# Patient Record
Sex: Female | Born: 1985
Health system: Southern US, Community
[De-identification: ages and names within clinical notes are randomized; demographics above are authoritative.]

## PROBLEM LIST (undated history)

## (undated) DIAGNOSIS — F329 Major depressive disorder, single episode, unspecified: Secondary | ICD-10-CM

## (undated) DIAGNOSIS — F32A Depression, unspecified: Secondary | ICD-10-CM

## (undated) DIAGNOSIS — J45909 Unspecified asthma, uncomplicated: Secondary | ICD-10-CM

## (undated) DIAGNOSIS — D649 Anemia, unspecified: Secondary | ICD-10-CM

## (undated) HISTORY — PX: INCISION AND DRAINAGE PERITONSILLAR ABSCESS: SUR670

## (undated) HISTORY — PX: INCISION AND DRAINAGE PERIRECTAL ABSCESS: SHX1804

---

## 2018-07-19 ENCOUNTER — Other Ambulatory Visit: Payer: Self-pay

## 2018-07-19 ENCOUNTER — Encounter (HOSPITAL_COMMUNITY): Payer: Self-pay | Admitting: Emergency Medicine

## 2018-07-19 ENCOUNTER — Observation Stay (HOSPITAL_COMMUNITY)
Admission: EM | Admit: 2018-07-19 | Discharge: 2018-07-21 | Disposition: A | Discharge status: Home / Self Care | Payer: Self-pay | Attending: Surgery | Admitting: Surgery

## 2018-07-19 ENCOUNTER — Emergency Department (HOSPITAL_COMMUNITY): Payer: Self-pay

## 2018-07-19 DIAGNOSIS — K611 Rectal abscess: Secondary | ICD-10-CM | POA: Diagnosis present

## 2018-07-19 DIAGNOSIS — Z882 Allergy status to sulfonamides status: Secondary | ICD-10-CM | POA: Insufficient documentation

## 2018-07-19 DIAGNOSIS — Z79899 Other long term (current) drug therapy: Secondary | ICD-10-CM | POA: Insufficient documentation

## 2018-07-19 DIAGNOSIS — F329 Major depressive disorder, single episode, unspecified: Secondary | ICD-10-CM | POA: Insufficient documentation

## 2018-07-19 DIAGNOSIS — K612 Anorectal abscess: Principal | ICD-10-CM | POA: Insufficient documentation

## 2018-07-19 DIAGNOSIS — J45909 Unspecified asthma, uncomplicated: Secondary | ICD-10-CM | POA: Insufficient documentation

## 2018-07-19 DIAGNOSIS — Z791 Long term (current) use of non-steroidal anti-inflammatories (NSAID): Secondary | ICD-10-CM | POA: Insufficient documentation

## 2018-07-19 DIAGNOSIS — K610 Anal abscess: Secondary | ICD-10-CM

## 2018-07-19 HISTORY — DX: Depression, unspecified: F32.A

## 2018-07-19 HISTORY — DX: Anemia, unspecified: D64.9

## 2018-07-19 HISTORY — DX: Major depressive disorder, single episode, unspecified: F32.9

## 2018-07-19 HISTORY — DX: Unspecified asthma, uncomplicated: J45.909

## 2018-07-19 LAB — BASIC METABOLIC PANEL
Anion gap: 9 (ref 5–15)
BUN: 7 mg/dL (ref 6–20)
CO2: 21 mmol/L — ABNORMAL LOW (ref 22–32)
Calcium: 8.7 mg/dL — ABNORMAL LOW (ref 8.9–10.3)
Chloride: 109 mmol/L (ref 98–111)
Creatinine, Ser: 0.86 mg/dL (ref 0.44–1.00)
GFR calc Af Amer: >60 mL/min (ref >60)
GFR calc non Af Amer: >60 mL/min (ref >60)
Glucose, Bld: 94 mg/dL (ref 70–99)
Potassium: 3.8 mmol/L (ref 3.5–5.1)
SODIUM: 139 mmol/L (ref 135–145)

## 2018-07-19 LAB — CBC WITH DIFFERENTIAL/PLATELET
Abs Immature Granulocytes: 0.03 10*3/uL (ref 0.00–0.07)
BASOS PCT: 0 %
Basophils Absolute: 0 10*3/uL (ref 0.0–0.1)
EOS ABS: 0 10*3/uL (ref 0.0–0.5)
Eosinophils Relative: 0 %
HCT: 30 % — ABNORMAL LOW (ref 36.0–46.0)
Hemoglobin: 8.7 g/dL — ABNORMAL LOW (ref 12.0–15.0)
Immature Granulocytes: 0 %
Lymphocytes Relative: 16 %
Lymphs Abs: 1.4 10*3/uL (ref 0.7–4.0)
MCH: 22.8 pg — ABNORMAL LOW (ref 26.0–34.0)
MCHC: 29 g/dL — ABNORMAL LOW (ref 30.0–36.0)
MCV: 78.7 fL — ABNORMAL LOW (ref 80.0–100.0)
Monocytes Absolute: 0.9 10*3/uL (ref 0.1–1.0)
Monocytes Relative: 10 %
Neutro Abs: 6.8 10*3/uL (ref 1.7–7.7)
Neutrophils Relative %: 74 %
PLATELETS: 434 10*3/uL — AB (ref 150–400)
RBC: 3.81 MIL/uL — AB (ref 3.87–5.11)
RDW: 17.3 % — ABNORMAL HIGH (ref 11.5–15.5)
WBC: 9.1 10*3/uL (ref 4.0–10.5)
nRBC: 0 % (ref 0.0–0.2)

## 2018-07-19 LAB — I-STAT BETA HCG BLOOD, ED (MC, WL, AP ONLY): I-stat hCG, quantitative: <5 m[IU]/mL (ref <5)

## 2018-07-19 MED ORDER — FENTANYL CITRATE (PF) 100 MCG/2ML IJ SOLN
25.0000 ug | Freq: Once | INTRAMUSCULAR | Status: AC
  Administered 2018-07-19: 25 ug via INTRAVENOUS
  Filled 2018-07-19: qty 2

## 2018-07-19 MED ORDER — MORPHINE SULFATE (PF) 2 MG/ML IV SOLN
2.0000 mg | INTRAVENOUS | Status: DC | PRN
  Administered 2018-07-19 – 2018-07-20 (×3): 2 mg via INTRAVENOUS
  Filled 2018-07-19 (×4): qty 1

## 2018-07-19 MED ORDER — ONDANSETRON HCL 4 MG/2ML IJ SOLN
4.0000 mg | Freq: Four times a day (QID) | INTRAMUSCULAR | Status: DC | PRN
  Administered 2018-07-19 – 2018-07-20 (×2): 4 mg via INTRAVENOUS
  Filled 2018-07-19: qty 2

## 2018-07-19 MED ORDER — ONDANSETRON HCL 4 MG/2ML IJ SOLN
4.0000 mg | Freq: Once | INTRAMUSCULAR | Status: AC
  Administered 2018-07-19: 4 mg via INTRAVENOUS
  Filled 2018-07-19: qty 2

## 2018-07-19 MED ORDER — CLINDAMYCIN PHOSPHATE 600 MG/50ML IV SOLN
600.0000 mg | Freq: Once | INTRAVENOUS | Status: AC
  Administered 2018-07-19: 600 mg via INTRAVENOUS
  Filled 2018-07-19: qty 50

## 2018-07-19 MED ORDER — SODIUM CHLORIDE 0.9 % IV SOLN
INTRAVENOUS | Status: DC
  Administered 2018-07-19 – 2018-07-21 (×4): via INTRAVENOUS

## 2018-07-19 MED ORDER — ONDANSETRON 4 MG PO TBDP: 4.0000 mg | ORAL_TABLET | Freq: Four times a day (QID) | ORAL | Status: DC | PRN

## 2018-07-19 MED ORDER — SODIUM CHLORIDE 0.9 % IV SOLN
INTRAVENOUS | Status: DC
  Administered 2018-07-20: via INTRAVENOUS

## 2018-07-19 MED ORDER — IOHEXOL 300 MG/ML  SOLN
100.0000 mL | Freq: Once | INTRAMUSCULAR | Status: AC | PRN
  Administered 2018-07-19: 100 mL via INTRAVENOUS

## 2018-07-19 MED ORDER — ENOXAPARIN SODIUM 40 MG/0.4ML ~~LOC~~ SOLN
40.0000 mg | SUBCUTANEOUS | Status: DC
  Administered 2018-07-19 – 2018-07-20 (×2): 40 mg via SUBCUTANEOUS
  Filled 2018-07-19 (×2): qty 0.4

## 2018-07-19 MED ORDER — METRONIDAZOLE IN NACL 5-0.79 MG/ML-% IV SOLN
500.0000 mg | Freq: Once | INTRAVENOUS | Status: AC
  Administered 2018-07-19: 500 mg via INTRAVENOUS
  Filled 2018-07-19: qty 100

## 2018-07-19 NOTE — H&P (Signed)
Kristen MeckelJasmine Miles is an 32 y.o. female.   Chief Complaint: perianal pain HPI: 32 yo healthy female who has what sounds like history of pilonidal abscess before with perianal pain since Wednesday. This has gotten worse. Nothing is making it better. She is having bms. No blood. No fevers.  She does not smoke.  She presented today as no improvement and was found on ct scan to have small abscess that wasn't immediately apparent on clinical exam by er.  I was asked to see her.  Past Medical History:  Diagnosis Date  . Asthma     History reviewed. No pertinent surgical history.  No family history on file. Social History:  has no history on file for tobacco, alcohol, and drug.  Allergies:  Allergies  Allergen Reactions  . Sulfa Antibiotics Hives and Rash  . Sulfur Hives and Rash    meds none  Results for orders placed or performed during the hospital encounter of 07/19/18 (from the past 48 hour(s))  CBC with Differential/Platelet     Status: Abnormal   Collection Time: 07/19/18  1:34 PM  Result Value Ref Range   WBC 9.1 4.0 - 10.5 K/uL   RBC 3.81 (L) 3.87 - 5.11 MIL/uL   Hemoglobin 8.7 (L) 12.0 - 15.0 g/dL   HCT 16.130.0 (L) 09.636.0 - 04.546.0 %   MCV 78.7 (L) 80.0 - 100.0 fL   MCH 22.8 (L) 26.0 - 34.0 pg   MCHC 29.0 (L) 30.0 - 36.0 g/dL   RDW 40.917.3 (H) 81.111.5 - 91.415.5 %   Platelets 434 (H) 150 - 400 K/uL   nRBC 0.0 0.0 - 0.2 %   Neutrophils Relative % 74 %   Neutro Abs 6.8 1.7 - 7.7 K/uL   Lymphocytes Relative 16 %   Lymphs Abs 1.4 0.7 - 4.0 K/uL   Monocytes Relative 10 %   Monocytes Absolute 0.9 0.1 - 1.0 K/uL   Eosinophils Relative 0 %   Eosinophils Absolute 0.0 0.0 - 0.5 K/uL   Basophils Relative 0 %   Basophils Absolute 0.0 0.0 - 0.1 K/uL   Immature Granulocytes 0 %   Abs Immature Granulocytes 0.03 0.00 - 0.07 K/uL    Comment: Performed at [facility], [address] [place], [place] [address]  Basic metabolic panel     Status: Abnormal   Collection Time: 07/19/18  1:34 PM    Result Value Ref Range   Sodium 139 135 - 145 mmol/L   Potassium 3.8 3.5 - 5.1 mmol/L   Chloride 109 98 - 111 mmol/L   CO2 21 (L) 22 - 32 mmol/L   Glucose, Bld 94 70 - 99 mg/dL   BUN 7 6 - 20 mg/dL   Creatinine, Ser 6.210.86 0.44 - 1.00 mg/dL   Calcium 8.7 (L) 8.9 - 10.3 mg/dL   GFR calc non Af Amer >60 >60 mL/min   GFR calc Af Amer >60 >60 mL/min   Anion gap 9 5 - 15    Comment: Performed at [facility], [address] [place], [place] [address]  I-Stat Beta hCG blood, ED ([facility], [facility], [facility] only)     Status: None   Collection Time: 07/19/18  1:38 PM  Result Value Ref Range   I-stat hCG, quantitative <5.0 <5 mIU/mL   Comment 3            Comment:   GEST. AGE      CONC.  (mIU/mL)   <=1 WEEK  5 - 50     2 WEEKS       50 - 500     3 WEEKS       100 - 10,000     4 WEEKS     1,000 - 30,000        FEMALE AND NON-PREGNANT FEMALE:     LESS THAN 5 mIU/mL    Ct Pelvis W Contrast  Result Date: 07/19/2018 CLINICAL DATA:  Acute rectal pain. EXAM: CT PELVIS WITH CONTRAST TECHNIQUE: Multidetector CT imaging of the pelvis was performed using the standard protocol following the bolus administration of intravenous contrast. CONTRAST:  100mL OMNIPAQUE IOHEXOL 300 MG/ML  SOLN COMPARISON:  None. FINDINGS: Urinary Tract:  No abnormality visualized. Bowel: There is no evidence of bowel dilatation or obstruction. The appendix is unremarkable. There is seen probable small fluid collection measuring 2.7 by 2.3 x 1.7 cm in the perianal region concerning for possible small abscess. Vascular/Lymphatic: No pathologically enlarged lymph nodes. No significant vascular abnormality seen. Reproductive:  No mass or other significant abnormality Other:  No hernia is noted.  No ascites is noted. Musculoskeletal: No suspicious bone lesions identified. IMPRESSION: 2.7 x 2.3 x 1.7 cm probable small fluid collection seen in the perianal region concerning for small abscess. Electronically Signed   By: Lupita RaiderJames  Green Jr, M.D.    On: 07/19/2018 15:33    Review of Systems  All other systems reviewed and are negative.   Blood pressure 95/66, pulse 80, temperature 98.7 F (37.1 C), temperature source Oral, resp. rate 18, last menstrual period 06/23/2018, SpO2 100 %. Physical Exam  Nursing note and vitals reviewed. Constitutional: She is oriented to person, place, and time. She appears well-developed and well-nourished.  HENT:  Head: Normocephalic and atraumatic.  Right Ear: External ear normal.  Left Ear: External ear normal.  Eyes: No scleral icterus.  Neck: Neck supple.  Cardiovascular: Normal rate, regular rhythm and normal heart sounds.  Respiratory: Effort normal and breath sounds normal. She has no wheezes.  GI: Soft. There is no abdominal tenderness.  Genitourinary: Rectum:     Tenderness present.        Genitourinary Comments: Right lateral induration and tenderness very near anus, difficult to examine due to tenderness    Lymphadenopathy:    She has no cervical adenopathy.  Neurological: She is alert and oriented to person, place, and time.  Skin: Skin is warm and dry. She is not diaphoretic.  Psychiatric: Her behavior is normal.     Assessment/Plan Perianal abscess  This is small but dont think can adequately do in the er.  Im not able to examine well enough due to pain.  Discussed going to or for I and D under anesthesia. Discussed risks and open wound. Likely home tomorrow and follow up in office. Will take to or when available  Emelia LoronMatthew Terrel Manalo, MD 07/19/2018, 5:36 PM

## 2018-07-19 NOTE — ED Notes (Signed)
Dr Dwain Sarnawakefield

## 2018-07-19 NOTE — ED Notes (Signed)
Pt still has much pain  Waiting on a c-t  Antibiotic has infused

## 2018-07-19 NOTE — ED Notes (Signed)
Pt has increasing pain  Requesting more pain med

## 2018-07-19 NOTE — ED Notes (Signed)
Pt c/o pain antibiotic started

## 2018-07-19 NOTE — ED Provider Notes (Signed)
[facility] EMERGENCY DEPARTMENT Provider Note   CSN: [id number] Arrival date & time: 07/19/18  1154     History   Chief Complaint Chief Complaint  Patient presents with  . Abscess    HPI Kristen Miles is a 32 y.o. female.  Patient is a 32 year old female past medical history of asthma and depression who is presenting today with worsening rectal pain for the last 4 days.  She states 2 days ago she was at [facility] and at that time had blood work and a CAT scan done that was within normal limits.  She was discharged home with ibuprofen.  She states however the pain has only worsened.  She has not noticed any pus drainage but does recall having an abscess on her buttocks one time several years ago.  She denies any vaginal discharge or vaginal pain.  She has not had fever, abdominal pain, nausea or vomiting.  She is not diabetic.  The history is provided by the patient.  Abscess  Location:  Pelvis Pelvic abscess location:  Anus Size:  Unknown Abscess quality: painful   Abscess quality: not draining   Red streaking: no   Duration:  4 days Progression:  Worsening Pain details:    Quality:  Sharp, shooting and throbbing   Severity:  Severe   Duration:  4 days   Timing:  Constant   Progression:  Worsening Chronicity:  New Context: not diabetes, not immunosuppression and not skin injury   Relieved by:  Nothing Exacerbated by: sitting or having bowel movements. Ineffective treatments:  Warm water soaks Associated symptoms: no anorexia, no fever, no nausea and no vomiting   Risk factors: prior abscess     Past Medical History:  Diagnosis Date  . Asthma     There are no active problems to display for this patient.   History reviewed. No pertinent surgical history.   OB History   No obstetric history on file.      Home Medications    Prior to Admission medications   Not on File    Family History No family history on file.  Social  History Social History   Tobacco Use  . Smoking status: Not on file  Substance Use Topics  . Alcohol use: Not on file  . Drug use: Not on file     Allergies   Sulfa antibiotics and Sulfur   Review of Systems Review of Systems  Constitutional: Negative for fever.  Gastrointestinal: Negative for anorexia, nausea and vomiting.  All other systems reviewed and are negative.    Physical Exam Updated Vital Signs BP 113/68 (BP Location: Left Arm)   Pulse 80   Temp 98.7 F (37.1 C) (Oral)   Resp 18   LMP 06/23/2018   SpO2 100%   Physical Exam Vitals signs and nursing note reviewed.  Constitutional:      General: She is not in acute distress.    Appearance: She is well-developed.     Comments: Tearful on exam and appears to be uncomfortable  HENT:     Head: Normocephalic and atraumatic.  Eyes:     Conjunctiva/sclera: Conjunctivae normal.     Pupils: Pupils are equal, round, and reactive to light.  Neck:     Musculoskeletal: Normal range of motion and neck supple.  Cardiovascular:     Rate and Rhythm: Normal rate and regular rhythm.     Heart sounds: No murmur.  Pulmonary:     Effort: Pulmonary effort is  normal. No respiratory distress.     Breath sounds: Normal breath sounds. No wheezing or rales.  Abdominal:     General: There is no distension.     Palpations: Abdomen is soft.     Tenderness: There is no abdominal tenderness. There is no guarding or rebound.  Genitourinary:    Rectum: Tenderness present. No external hemorrhoid.    Musculoskeletal: Normal range of motion.        General: No tenderness.  Skin:    General: Skin is warm and dry.     Findings: No erythema or rash.  Neurological:     Mental Status: She is alert and oriented to person, place, and time.  Psychiatric:        Behavior: Behavior normal.      ED Treatments / Results  Labs (all labs ordered are listed, but only abnormal results are displayed) Labs Reviewed  CBC WITH  DIFFERENTIAL/PLATELET - Abnormal; Notable for the following components:      Result Value   RBC 3.81 (*)    Hemoglobin 8.7 (*)    HCT 30.0 (*)    MCV 78.7 (*)    MCH 22.8 (*)    MCHC 29.0 (*)    RDW 17.3 (*)    Platelets 434 (*)    All other components within normal limits  BASIC METABOLIC PANEL - Abnormal; Notable for the following components:   CO2 21 (*)    Calcium 8.7 (*)    All other components within normal limits  I-STAT BETA HCG BLOOD, ED ([facility], [facility], [facility] ONLY)    EKG None  Radiology Ct Pelvis W Contrast  Result Date: 07/19/2018 CLINICAL DATA:  Acute rectal pain. EXAM: CT PELVIS WITH CONTRAST TECHNIQUE: Multidetector CT imaging of the pelvis was performed using the standard protocol following the bolus administration of intravenous contrast. CONTRAST:  100mL OMNIPAQUE IOHEXOL 300 MG/ML  SOLN COMPARISON:  None. FINDINGS: Urinary Tract:  No abnormality visualized. Bowel: There is no evidence of bowel dilatation or obstruction. The appendix is unremarkable. There is seen probable small fluid collection measuring 2.7 by 2.3 x 1.7 cm in the perianal region concerning for possible small abscess. Vascular/Lymphatic: No pathologically enlarged lymph nodes. No significant vascular abnormality seen. Reproductive:  No mass or other significant abnormality Other:  No hernia is noted.  No ascites is noted. Musculoskeletal: No suspicious bone lesions identified. IMPRESSION: 2.7 x 2.3 x 1.7 cm probable small fluid collection seen in the perianal region concerning for small abscess. Electronically Signed   By: Lupita RaiderJames  Green Jr, M.D.   On: 07/19/2018 15:33    Procedures Procedures (including critical care time)  Medications Ordered in ED Medications  fentaNYL (SUBLIMAZE) injection 25 mcg (has no administration in time range)  ondansetron (ZOFRAN) injection 4 mg (has no administration in time range)     Initial Impression / Assessment and Plan / ED Course  I have reviewed the triage vital  signs and the nursing notes.  Pertinent labs & imaging results that were available during my care of the patient were reviewed by me and considered in my medical decision making (see chart for details).     Presenting with symptoms concerning for perirectal abscess.  No visible abscess externally but induration palpated internally on the rectum.  No buttocks abscesses or vaginal abscesses present.  Labs, CT pending.  Patient given pain control and IV antibiotics.  4:11 PM Labs with chronic anemia but no other acute findings.  CT shows 2.7 x 2.3  x 1.7 cm fluid collection seen in the perianal region concerning for abscess.  On repeat evaluation there is no visual area that I feel comfortable draining in the emergency room.  Will discuss with general surgery.  4:23 PM Dr. Dwain SarnaWakefield will see the pt.  Final Clinical Impressions(s) / ED Diagnoses   Final diagnoses:  Perianal abscess    ED Discharge Orders    None       Gwyneth SproutPlunkett, Virginia Curl, MD 07/19/18 1623

## 2018-07-19 NOTE — ED Triage Notes (Signed)
Pt here for reevaluation of abscess to buttocks/rectum. Had a CT scan done at [place]. Pt tearful, states the pain is worse. Was not started on an antibiotic.

## 2018-07-19 NOTE — ED Notes (Signed)
Pt requesting pain meds at this time.

## 2018-07-20 ENCOUNTER — Observation Stay (HOSPITAL_COMMUNITY): Payer: Self-pay | Admitting: Certified Registered Nurse Anesthetist

## 2018-07-20 ENCOUNTER — Encounter (HOSPITAL_COMMUNITY)
Admission: EM | Disposition: A | Discharge status: Home / Self Care | Payer: Self-pay | Source: Home / Self Care | Attending: Emergency Medicine

## 2018-07-20 HISTORY — PX: INCISION AND DRAINAGE PERIRECTAL ABSCESS: SHX1804

## 2018-07-20 SURGERY — INCISION AND DRAINAGE, ABSCESS, PERIRECTAL
Anesthesia: General

## 2018-07-20 MED ORDER — PROPOFOL 10 MG/ML IV BOLUS
INTRAVENOUS | Status: AC
  Filled 2018-07-20: qty 40

## 2018-07-20 MED ORDER — ROCURONIUM BROMIDE 10 MG/ML (PF) SYRINGE
PREFILLED_SYRINGE | INTRAVENOUS | Status: DC | PRN
  Administered 2018-07-20: 50 mg via INTRAVENOUS

## 2018-07-20 MED ORDER — FENTANYL CITRATE (PF) 250 MCG/5ML IJ SOLN
INTRAMUSCULAR | Status: AC
  Filled 2018-07-20: qty 5

## 2018-07-20 MED ORDER — 0.9 % SODIUM CHLORIDE (POUR BTL) OPTIME
TOPICAL | Status: DC | PRN
  Administered 2018-07-20: 1000 mL

## 2018-07-20 MED ORDER — BUPIVACAINE-EPINEPHRINE 0.25% -1:200000 IJ SOLN
INTRAMUSCULAR | Status: DC | PRN
  Administered 2018-07-20: 10 mL

## 2018-07-20 MED ORDER — PROPOFOL 10 MG/ML IV BOLUS
INTRAVENOUS | Status: DC | PRN
  Administered 2018-07-20: 200 mg via INTRAVENOUS

## 2018-07-20 MED ORDER — ONDANSETRON HCL 4 MG/2ML IJ SOLN
INTRAMUSCULAR | Status: DC | PRN
  Administered 2018-07-20: 4 mg via INTRAVENOUS

## 2018-07-20 MED ORDER — BUPIVACAINE-EPINEPHRINE (PF) 0.25% -1:200000 IJ SOLN
INTRAMUSCULAR | Status: AC
  Filled 2018-07-20: qty 30

## 2018-07-20 MED ORDER — ZOLPIDEM TARTRATE 5 MG PO TABS
5.0000 mg | ORAL_TABLET | Freq: Once | ORAL | Status: AC
  Administered 2018-07-20: 5 mg via ORAL
  Filled 2018-07-20: qty 1

## 2018-07-20 MED ORDER — OXYCODONE-ACETAMINOPHEN 5-325 MG PO TABS
1.0000 | ORAL_TABLET | ORAL | Status: DC | PRN
  Administered 2018-07-20 – 2018-07-21 (×4): 1 via ORAL
  Filled 2018-07-20 (×4): qty 1

## 2018-07-20 MED ORDER — DEXAMETHASONE SODIUM PHOSPHATE 10 MG/ML IJ SOLN
INTRAMUSCULAR | Status: DC | PRN
  Administered 2018-07-20: 10 mg via INTRAVENOUS

## 2018-07-20 MED ORDER — SERTRALINE HCL 50 MG PO TABS
50.0000 mg | ORAL_TABLET | Freq: Every day | ORAL | Status: DC
  Administered 2018-07-21: 50 mg via ORAL
  Filled 2018-07-20: qty 1

## 2018-07-20 MED ORDER — LACTATED RINGERS IV SOLN
INTRAVENOUS | Status: DC | PRN
  Administered 2018-07-20: 09:00:00 via INTRAVENOUS

## 2018-07-20 MED ORDER — LIDOCAINE 2% (20 MG/ML) 5 ML SYRINGE
INTRAMUSCULAR | Status: DC | PRN
  Administered 2018-07-20: 100 mg via INTRAVENOUS

## 2018-07-20 MED ORDER — CEFAZOLIN SODIUM-DEXTROSE 2-3 GM-%(50ML) IV SOLR
INTRAVENOUS | Status: DC | PRN
  Administered 2018-07-20 (×2): 2 g via INTRAVENOUS

## 2018-07-20 MED ORDER — LIDOCAINE 2% (20 MG/ML) 5 ML SYRINGE
INTRAMUSCULAR | Status: AC
  Filled 2018-07-20: qty 5

## 2018-07-20 MED ORDER — FENTANYL CITRATE (PF) 100 MCG/2ML IJ SOLN
INTRAMUSCULAR | Status: DC | PRN
  Administered 2018-07-20 (×2): 50 ug via INTRAVENOUS
  Administered 2018-07-20: 150 ug via INTRAVENOUS

## 2018-07-20 MED ORDER — MIDAZOLAM HCL 2 MG/2ML IJ SOLN
INTRAMUSCULAR | Status: AC
  Filled 2018-07-20: qty 2

## 2018-07-20 MED ORDER — MIDAZOLAM HCL 2 MG/2ML IJ SOLN
INTRAMUSCULAR | Status: DC | PRN
  Administered 2018-07-20: 2 mg via INTRAVENOUS

## 2018-07-20 MED ORDER — ROCURONIUM BROMIDE 50 MG/5ML IV SOSY
PREFILLED_SYRINGE | INTRAVENOUS | Status: AC
  Filled 2018-07-20: qty 5

## 2018-07-20 SURGICAL SUPPLY — 28 items
BRIEF STRETCH FOR OB PAD LRG (UNDERPADS AND DIAPERS) ×3 IMPLANT
COVER SURGICAL LIGHT HANDLE (MISCELLANEOUS) ×3 IMPLANT
ELECT CAUTERY BLADE 6.4 (BLADE) ×3 IMPLANT
ELECT REM PT RETURN 9FT ADLT (ELECTROSURGICAL) ×3
ELECTRODE REM PT RTRN 9FT ADLT (ELECTROSURGICAL) ×1 IMPLANT
GAUZE PACKING IODOFORM 1/2 (PACKING) ×3 IMPLANT
GAUZE SPONGE 4X4 12PLY STRL (GAUZE/BANDAGES/DRESSINGS) ×3 IMPLANT
GLOVE BIO SURGEON STRL SZ8 (GLOVE) ×3 IMPLANT
GLOVE BIOGEL PI IND STRL 8 (GLOVE) ×1 IMPLANT
GLOVE BIOGEL PI INDICATOR 8 (GLOVE) ×2
GOWN STRL REUS W/ TWL XL LVL3 (GOWN DISPOSABLE) ×2 IMPLANT
GOWN STRL REUS W/TWL XL LVL3 (GOWN DISPOSABLE) ×4
KIT BASIN OR (CUSTOM PROCEDURE TRAY) ×3 IMPLANT
KIT TURNOVER KIT B (KITS) ×3 IMPLANT
NEEDLE HYPO 25GX1X1/2 BEV (NEEDLE) ×3 IMPLANT
NS IRRIG 1000ML POUR BTL (IV SOLUTION) ×3 IMPLANT
PAD ABD 8X10 STRL (GAUZE/BANDAGES/DRESSINGS) ×6 IMPLANT
PAD ARMBOARD 7.5X6 YLW CONV (MISCELLANEOUS) ×3 IMPLANT
PENCIL BUTTON HOLSTER BLD 10FT (ELECTRODE) ×3 IMPLANT
SWAB COLLECTION DEVICE MRSA (MISCELLANEOUS) ×3 IMPLANT
SWAB CULTURE ESWAB REG 1ML (MISCELLANEOUS) ×3 IMPLANT
SYR BULB IRRIGATION 50ML (SYRINGE) ×3 IMPLANT
SYR CONTROL 10ML LL (SYRINGE) ×3 IMPLANT
TOWEL OR 17X24 6PK STRL BLUE (TOWEL DISPOSABLE) ×3 IMPLANT
TOWEL OR 17X26 10 PK STRL BLUE (TOWEL DISPOSABLE) ×3 IMPLANT
TUBE CONNECTING 12'X1/4 (SUCTIONS) ×1
TUBE CONNECTING 12X1/4 (SUCTIONS) ×2 IMPLANT
YANKAUER SUCT BULB TIP NO VENT (SUCTIONS) ×3 IMPLANT

## 2018-07-20 NOTE — Anesthesia Procedure Notes (Signed)
Procedure Name: Intubation Date/Time: 07/20/2018 9:06 AM Performed by: Leonor Liv, CRNA Pre-anesthesia Checklist: Patient identified, Emergency Drugs available, Suction available and Patient being monitored Patient Re-evaluated:Patient Re-evaluated prior to induction Oxygen Delivery Method: Circle System Utilized Preoxygenation: Pre-oxygenation with 100% oxygen Induction Type: IV induction Ventilation: Mask ventilation without difficulty Laryngoscope Size: Mac and 3 Grade View: Grade I Tube type: Oral Tube size: 7.0 mm Number of attempts: 1 Airway Equipment and Method: Stylet and Oral airway Placement Confirmation: ETT inserted through vocal cords under direct vision,  positive ETCO2 and breath sounds checked- equal and bilateral Secured at: 22 cm Tube secured with: Tape Dental Injury: Teeth and Oropharynx as per pre-operative assessment

## 2018-07-20 NOTE — Interval H&P Note (Signed)
History and Physical Interval Note:  07/20/2018 8:17 AM  Kristen MeckelJasmine Miles  has presented today for surgery, with the diagnosis of ABSCESS  The various methods of treatment have been discussed with the patient and family. After consideration of risks, benefits and other options for treatment, the patient has consented to  Procedure(s): IRRIGATION AND DEBRIDEMENT PERIRECTAL ABSCESS (N/A) as a surgical intervention .  The patient's history has been reviewed, patient examined, no change in status, stable for surgery.  I have reviewed the patient's chart and labs.  Questions were answered to the patient's satisfaction.     Adelina Collard A Starasia Sinko

## 2018-07-20 NOTE — Transfer of Care (Signed)
Immediate Anesthesia Transfer of Care Note  Patient: Kristen MeckelJasmine Miles  Procedure(s) Performed: IRRIGATION AND DEBRIDEMENT PERIRECTAL ABSCESS (N/A )  Patient Location: PACU  Anesthesia Type:General  Level of Consciousness: awake, alert  and oriented  Airway & Oxygen Therapy: Patient Spontanous Breathing and Patient connected to nasal cannula oxygen  Post-op Assessment: Report given to RN, Post -op Vital signs reviewed and stable and Patient moving all extremities X 4  Post vital signs: Reviewed and stable  Last Vitals:  Vitals Value Taken Time  BP 111/69 07/20/2018 10:00 AM  Temp 36.1 C 07/20/2018  9:52 AM  Pulse 93 07/20/2018 10:00 AM  Resp 20 07/20/2018 10:00 AM  SpO2 100 % 07/20/2018 10:00 AM  Vitals shown include unvalidated device data.  Last Pain:  Vitals:   07/20/18 0952  TempSrc:   PainSc: 4          Complications: No apparent anesthesia complications

## 2018-07-20 NOTE — Anesthesia Preprocedure Evaluation (Signed)
Anesthesia Evaluation  Patient identified by MRN, date of birth, ID band Patient awake    Reviewed: Allergy & Precautions, NPO status , Patient's Chart, lab work & pertinent test results  Airway Mallampati: II  TM Distance: >3 FB Neck ROM: Full    Dental  (+) Dental Advisory Given   Pulmonary neg pulmonary ROS,    Pulmonary exam normal breath sounds clear to auscultation       Cardiovascular negative cardio ROS Normal cardiovascular exam Rhythm:Regular Rate:Normal     Neuro/Psych negative neurological ROS  negative psych ROS   GI/Hepatic negative GI ROS, Neg liver ROS,   Endo/Other  negative endocrine ROS  Renal/GU negative Renal ROS     Musculoskeletal negative musculoskeletal ROS (+)   Abdominal   Peds  Hematology negative hematology ROS (+)   Anesthesia Other Findings   Reproductive/Obstetrics negative OB ROS                             Anesthesia Physical Anesthesia Plan  ASA: II  Anesthesia Plan: General   Post-op Pain Management:    Induction: Intravenous  PONV Risk Score and Plan: 4 or greater and Ondansetron, Dexamethasone, Midazolam and Treatment may vary due to age or medical condition  Airway Management Planned: Oral ETT  Additional Equipment: None  Intra-op Plan:   Post-operative Plan: Extubation in OR  Informed Consent: I have reviewed the patients History and Physical, chart, labs and discussed the procedure including the risks, benefits and alternatives for the proposed anesthesia with the patient or authorized representative who has indicated his/her understanding and acceptance.   Dental advisory given  Plan Discussed with: CRNA  Anesthesia Plan Comments:         Anesthesia Quick Evaluation

## 2018-07-20 NOTE — Plan of Care (Signed)
  Problem: Pain Managment: Goal: General experience of comfort will improve Outcome: Progressing   Problem: Safety: Goal: Ability to remain free from injury will improve Outcome: Progressing   Problem: Skin Integrity: Goal: Risk for impaired skin integrity will decrease Outcome: Progressing   

## 2018-07-20 NOTE — Progress Notes (Signed)
Patient requesting something for sleep. [facility] on call provider paged ( DR wakefield) order given to give patient mg Ambien with a sip of water.

## 2018-07-20 NOTE — Anesthesia Postprocedure Evaluation (Signed)
Anesthesia Post Note  Patient: Kristen MeckelJasmine Miles  Procedure(s) Performed: IRRIGATION AND DEBRIDEMENT PERIRECTAL ABSCESS (N/A )     Patient location during evaluation: PACU Anesthesia Type: General Level of consciousness: sedated and patient cooperative Pain management: pain level controlled Vital Signs Assessment: post-procedure vital signs reviewed and stable Respiratory status: spontaneous breathing Cardiovascular status: stable Anesthetic complications: no    Last Vitals:  Vitals:   07/20/18 1039 07/20/18 1604  BP: 106/63 (!) 99/56  Pulse: 81 78  Resp: 20 20  Temp: 36.7 C 36.7 C  SpO2: 99% 98%    Last Pain:  Vitals:   07/20/18 1604  TempSrc: Oral  PainSc:                  Lewie LoronJohn Braidyn Peace

## 2018-07-20 NOTE — Op Note (Signed)
Preoperative diagnosis: Complex posterior perirectal abscess  Postoperative diagnosis: Same  Procedure: Drainage of complex posterior peritoneal abscess  Surgeon: Erroll Luna, MD  Anesthesia: General with 0.25% Sensorcaine local with epinephrine  EBL: 10 cc  Specimen: Culture sent  Drains: None  IV fluids: Per anesthesia record  Indications for procedure: The patient is a 32 year old female made yesterday with a complex perirectal abscess.  She presents today for drainage.  Risk, benefits and other treatment options discussed with the patient in the holding area.  The procedure was discussed as well as long-term expectations, postoperative wound care, postoperative expectations of healing, and the possibility of developing a fistula in a no and about 20% of patients.The procedure has been discussed with the patient.  Alternative therapies have been discussed with the patient.  Operative risks include bleeding,  Infection,  Organ injury,  Nerve injury,  Blood vessel injury,  DVT,  Pulmonary embolism,  Death,  And possible reoperation.  Medical management risks include worsening of present situation.  The success of the procedure is 50 -90 % at treating patients symptoms.  The patient understands and agrees to proceed.   Description of procedure: The patient was met in the holding area.  Questions were answered.  She was taken back to the operating room where she was intubated in the supine position on her bed.  She was then placed prone jackknife.  She was padded appropriately.  Her anal canal was then prepped and draped in sterile fashion after proper exposure of the region.  Timeout was done.  She received preoperative antibiotics.  The abscess was easily visible in the posterior midline.  Local anesthetic was infiltrated and a cruciate incision was made with copious amounts of pus that came out with drainage.  The skin edges were cut to make a nice opening and this was packed after  irrigation with 1 inch iodoform packing.  This was about a 2 cm cavity altogether.  There is no sphincter muscle involved.  Dry dressings were then applied.  Hemostasis achieved.  She was then placed back supine on the bed extubated taken to recovery in satisfactory condition.  All final counts found to be correct.

## 2018-07-21 ENCOUNTER — Encounter (HOSPITAL_COMMUNITY): Payer: Self-pay | Admitting: Surgery

## 2018-07-21 MED ORDER — ACETAMINOPHEN 500 MG PO TABS: ORAL_TABLET | ORAL | 0 refills | Status: AC

## 2018-07-21 MED ORDER — IBUPROFEN 200 MG PO TABS: ORAL_TABLET | ORAL | Status: AC

## 2018-07-21 MED ORDER — IBUPROFEN 600 MG PO TABS: 600.0000 mg | ORAL_TABLET | Freq: Four times a day (QID) | ORAL | Status: DC | PRN

## 2018-07-21 MED ORDER — ACETAMINOPHEN 500 MG PO TABS
1000.0000 mg | ORAL_TABLET | Freq: Three times a day (TID) | ORAL | Status: DC
  Administered 2018-07-21: 1000 mg via ORAL
  Filled 2018-07-21: qty 2

## 2018-07-21 MED ORDER — OXYCODONE HCL 5 MG PO TABS: 5.0000 mg | ORAL_TABLET | ORAL | Status: DC | PRN

## 2018-07-21 MED ORDER — AMOXICILLIN-POT CLAVULANATE 875-125 MG PO TABS: 1.0000 | ORAL_TABLET | Freq: Two times a day (BID) | ORAL | 0 refills | Status: AC

## 2018-07-21 MED ORDER — AMOXICILLIN-POT CLAVULANATE 875-125 MG PO TABS
1.0000 | ORAL_TABLET | Freq: Two times a day (BID) | ORAL | Status: DC
  Administered 2018-07-21: 1 via ORAL
  Filled 2018-07-21: qty 1

## 2018-07-21 MED ORDER — OXYCODONE HCL 5 MG PO TABS: 5.0000 mg | ORAL_TABLET | Freq: Four times a day (QID) | ORAL | 0 refills | Status: AC | PRN

## 2018-07-21 MED FILL — oxyCODONE HCL 5 MG TABS: 5 | 4 days supply | Qty: 15 | Fill #0

## 2018-07-21 MED FILL — AMOX-CLAV 875-125 MG TABLET: 875-125 | 2 days supply | Qty: 4 | Fill #0

## 2018-07-21 NOTE — Care Management Note (Addendum)
Case Management Note  Patient Details  Name: Vilma MeckelJasmine Jupin MRN: [id number] Date of Birth: 07/15/1986  Subjective/Objective:                    Action/Plan:  [facility], patient has co pay. Patient has boyfriend to assist her at home. Bedside nurse will teach wound care.  [facility] clinic information placed on discharge paperwork Expected Discharge Date:                  Expected Discharge Plan:  Home/Self Care  In-House Referral:  Financial Counselor  Discharge planning Services  CM Consult, Medication Assistance, MATCH Program, Indigent Health Clinic  Post Acute Care Choice:  NA Choice offered to:  Patient  DME Arranged:  N/A DME Agency:  NA  HH Arranged:  NA HH Agency:  NA  Status of Service:  Completed, signed off  If discussed at Long Length of Stay Meetings, dates discussed:    Additional Comments:  Kingsley PlanWile, Decklan Mau Marie, RN 07/21/2018, 10:48 AM

## 2018-07-21 NOTE — Discharge Instructions (Signed)
How to Take a [employer] Bath Draw a tub of warm water, and just sit in it for about 10 minutes.  After that she can stand up and shower.  It is okay to get soap and water into the site.  You also may want to shower get this area clean after bowel movements.  Dry area and then keep a dressing in place to prevent leakage onto your clothing.  I would do the sitz bath and shower at least 2 or 3 times per day, until the drainage resolves, and the site heals in.  A sitz bath is a warm water bath that may be used to care for your rectum, genital area, or the area between your rectum and genitals (perineum). For a sitz bath, the water only comes up to your hips and covers your buttocks. A sitz bath may done at home in a bathtub or with a portable sitz bath that fits over the toilet. Your health care provider may recommend a sitz bath to help:  Relieve pain and discomfort after delivering a baby.  Relieve pain and itching from hemorrhoids or anal fissures.  Relieve pain after certain surgeries.  Relax muscles that are sore or tight. How to take a sitz bath Take 3-4 sitz baths a day, or as many as told by your health care provider. Bathtub sitz bath To take a sitz bath in a bathtub: 1. Partially fill a bathtub with warm water. The water should be deep enough to cover your hips and buttocks when you are sitting in the tub. 2. If your health care provider told you to put medicine in the water, follow his or her instructions. 3. Sit in the water. 4. Open the tub drain a little, and leave it open during your bath. 5. Turn on the warm water again, enough to replace the water that is draining out. Keep the water running throughout your bath. This helps keep the water at the right level and the right temperature. 6. Soak in the water for 15-20 minutes, or as long as told by your health care provider. 7. When you are done, be careful when you stand up. You may feel dizzy. 8. After the sitz bath, pat yourself dry.  Do not rub your skin to dry it.  Over-the-toilet sitz bath To take a sitz bath with an over-the-toilet basin: 1. Follow the manufacturer's instructions. 2. Fill the basin with warm water. 3. If your health care provider told you to put medicine in the water, follow his or her instructions. 4. Sit on the seat. Make sure the water covers your buttocks and perineum. 5. Soak in the water for 15-20 minutes, or as long as told by your health care provider. 6. After the sitz bath, pat yourself dry. Do not rub your skin to dry it. 7. Clean and dry the basin between uses. 8. Discard the basin if it cracks, or according to the manufacturer's instructions. Contact a health care provider if:  Your symptoms get worse. Do not continue with sitz baths if your symptoms get worse.  You have new symptoms. If this happens, do not continue with sitz baths until you talk with your health care provider. Summary  A sitz bath is a warm water bath in which the water only comes up to your hips and covers your buttocks.  A sitz bath may help relieve itching, relieve pain, and relax muscles that are sore or tight in the lower part of your body, including your  genital area.  Take 3-4 sitz baths a day, or as many as told by your health care provider. Soak in the water for 15-20 minutes.  Do not continue with sitz baths if your symptoms get worse. This information is not intended to replace advice given to you by your health care provider. Make sure you discuss any questions you have with your health care provider. Document Released: 03/31/2004 Document Revised: 07/11/2017 Document Reviewed: 07/11/2017 Elsevier Interactive Patient Education  2019 [employer]vier Inc[employer]     Anorectal Abscess An abscess is an infected area that contains a collection of pus. An anorectal abscess is an abscess that is near the opening of the anus or around the rectum. Without treatment, an anorectal abscess can become larger and cause other  problems, such as a more serious body-wide infection or pain, especially during bowel movements. What are the causes? This condition is caused by plugged glands or an infection in one of these areas:  The anus.  The area between the anus and the scrotum in males or between the anus and the vagina in females (perineum). What increases the risk? The following factors may make you more likely to develop this condition:  Diabetes or inflammatory bowel disease.  Having a body defense system (immune system) that is weak.  Engaging in anal sex.  Having a sexually transmitted infection (STI).  Certain kinds of cancer, such as rectal carcinoma, leukemia, or lymphoma. What are the signs or symptoms? The main symptom of this condition is pain. The pain may be a throbbing pain that gets worse during bowel movements. Other symptoms include:  Swelling and redness in the area of the abscess. The redness may go beyond the abscess and appear as a red streak on the skin.  A visible, painful lump, or a lump that can be felt when touched.  Bleeding or pus-like discharge from the area.  Fever.  General weakness.  Constipation.  Diarrhea. How is this diagnosed? This condition is diagnosed based on your medical history and a physical exam of the affected area.  This may involve examining the rectal area with a gloved hand (digital rectal exam).  Sometimes, the health care provider needs to look into the rectum using a probe, scope, or imaging test.  For women, it may require a careful vaginal exam. How is this treated? Treatment for this condition may include:  Incision and drainage surgery. This involves making an incision over the abscess to drain the pus.  Medicines, including antibiotic medicine, pain medicine, stool softeners, or laxatives. Follow these instructions at home: Medicines  Take over-the-counter and prescription medicines only as told by your health care provider.  If  you were prescribed an antibiotic medicine, use it as told by your health care provider. Do not stop using the antibiotic even if you start to feel better.  Do not drive or use heavy machinery while taking prescription pain medicine. Wound care: Shower keep as clean as possible.  There are no sutures to remove.  The site is open.   If gauze was used in the abscess, follow instructions from your health care provider about removing or changing the gauze. It can usually be removed in 2-3 days.  Wash your hands with soap and water before you remove or change your gauze. If soap and water are not available, use hand sanitizer.  If one or more drains were placed in the abscess cavity, be careful not to pull at them. Your health care provider will tell  you how long they need to remain in place.  Check your incision area every day for signs of infection. Check for: ? More redness, swelling, or pain. ? More fluid or blood. ? Warmth. ? Pus or a bad smell. Managing pain, stiffness, and swelling   Take a sitz bath 3-4 times a day and after bowel movements. This will help reduce pain and swelling.  To relieve pain, try sitting: ? On a heating pad with the setting on low. ? On an inflatable donut-shaped cushion.  If directed, put ice on the affected area: ? Put ice in a plastic bag. ? Place a towel between your skin and the bag. ? Leave the ice on for 20 minutes, 2-3 times a day. General instructions  Follow any diet instructions given by your health care provider.  Keep all follow-up visits as told by your health care provider. This is important. Contact a health care provider if you have:  Bleeding from your incision.  Pain, swelling, or redness that does not improve or gets worse.  Trouble passing stool or urine.  Symptoms that return after treatment. Get help right away if you:  Have problems moving or using your legs.  Have severe or increasing pain.  Have swelling in the  affected area that suddenly gets worse.  Have a large increase in bleeding or passing of pus.  Develop chills or a fever. Summary  An anorectal abscess is an abscess that is near the opening of the anus or around the rectum. An abscess is an infected area that contains a collection of pus.  The main symptom of this condition is pain. It may be a throbbing pain that gets worse during bowel movements.  Treatment for an anorectal abscess may include surgery to drain the pus from the abscess. Medicines and sitz baths may also be a part of your treatment plan. This information is not intended to replace advice given to you by your health care provider. Make sure you discuss any questions you have with your health care provider. Document Released: 07/06/2000 Document Revised: 08/15/2017 Document Reviewed: 08/15/2017 Elsevier Interactive Patient Education  2019 [employer].

## 2018-07-21 NOTE — Discharge Summary (Signed)
Physician Discharge Summary  Patient ID: Kristen Miles MRN: [id number] DOB/AGE: 1985-08-08 32 y.o.  Admit date: 07/19/2018 Discharge date: 07/21/2018  Admission Diagnoses:  Complex posterior perirectal abscess  Discharge Diagnoses:  Complex posterior perirectal abscess  Active Problems:   Perirectal abscess   PROCEDURES: Incision and drainage of complex perirectal abscess, 07[facility]20/18, [facility]  Hospital Course:  32 yo healthy female who has what sounds like history of pilonidal abscess before with perianal pain since Wednesday. This has gotten worse. Nothing is making it better. She is having bms. No blood. No fevers.  She does not smoke.  She presented today as no improvement and was found on ct scan to have small abscess that wasn't immediately apparent on clinical exam by er.  Dr Dwain SarnaWakefield  was asked to see her.  She was admitted, placed on antibiotics and taken to the OR the following AM.  Post op she has done well, we took the packing out and she showered, they did not have a Sitz bath available.  We are going to leave the site open and she has been instructed to keep it clean and dry as possible.  We are giving her 2 more days of oral Augmentin for her abscess.  We will not try to pack this it has been opened widely; packing is not practical.  She will follow up in the office with the [facility].  She was instructed in Sitz baths, showers and local wound care.    CBC Latest Ref Rng & Units 07/19/2018  WBC 4.0 - 10.5 K/uL 9.1  Hemoglobin 12.0 - 15.0 g/dL 4.0(J8.7(L)  Hematocrit 81.136.0 - 46.0 % 30.0(L)  Platelets 150 - 400 K/uL 434(H)   CMP Latest Ref Rng & Units 07/19/2018  Glucose 70 - 99 mg/dL 94  BUN 6 - 20 mg/dL 7  Creatinine 9.140.44 - 7.821.00 mg/dL 9.560.86  Sodium 213135 - 086145 mmol/L 139  Potassium 3.5 - 5.1 mmol/L 3.8  Chloride 98 - 111 mmol/L 109  CO2 22 - 32 mmol/L 21(L)  Calcium 8.9 - 10.3 mg/dL 5.7(Q8.7(L)   Condition on DC:  Improved    Disposition: Discharge disposition:  01-Home or Self Care        Allergies as of 07/21/2018      Reactions   Sulfa Antibiotics Hives, Rash   Sulfur Hives, Rash      Medication List    TAKE these medications   acetaminophen 500 MG tablet Commonly known as:  TYLENOL You can take 1000 mg every 8 hours as needed for pain.  This should be your first choice for pain control.  Do not exceed 4000 mg/day.  You can buy this over-the-counter at any drugstore.   amoxicillin-clavulanate 875-125 MG tablet Commonly known as:  AUGMENTIN Take 1 tablet by mouth every 12 (twelve) hours.   ibuprofen 200 MG tablet Commonly known as:  ADVIL,MOTRIN You can take 2 to 3 tablets every 6 hours as needed for pain.  This is your second pain control medication.  You can started about 2 or 3 hours after your first dose of Tylenol.  You can buy this over-the-counter at any drugstore. What changed:    how much to take  how to take this  when to take this  reasons to take this  additional instructions   oxyCODONE 5 MG immediate release tablet Commonly known as:  Oxy IR/ROXICODONE Take 1 tablet (5 mg total) by mouth every 6 (six) hours as needed for moderate pain, severe pain  or breakthrough pain.   sertraline 50 MG tablet Commonly known as:  ZOLOFT Take 50 mg by mouth daily.      Follow-up Information    [facility].   Specialty:  General Surgery Why:  Our office should call you with a follow-up appointment in about 2 weeks and the doc of the week clinic.  If you do not hear by January 3 call and ask for follow-up appointment in 2 weeks.  Bring photo ID and insurance information. Contact information: [address] [place] [address] [phone]        [facility] Schedule an appointment as soon as possible for a visit.   Specialty:  Family Medicine Contact information: [address] [place] [address] [phone]            Signed: Sherrie GeorgeJENNINGS,Winnifred Dufford 07/21/2018, 1:14 PM

## 2018-07-21 NOTE — Progress Notes (Addendum)
1 Day Post-Op    CC: Perirectal pain  Subjective: She is really quite tender, I got about half the packing out.  We are going to put her in a sitz bath, and let her the nurse pulled her remaining packing out after she soaked and had some pain medication.  Objective: Vital signs in last 24 hours: Temp:  [97 F (36.1 C)-98.1 F (36.7 C)] 97.3 F (36.3 C) (12/30 0536) Pulse Rate:  [49-100] 54 (12/30 0536) Resp:  [16-27] 16 (12/30 0536) BP: (99-112)/(50-69) 112/58 (12/30 0536) SpO2:  [96 %-100 %] 98 % (12/30 0536) Last BM Date: 07/20/18 240 p.o. 1871 IV 900 urine Stool x1 Afebrile vital signs are stable No labs  Intake/Output from previous day: 12/29 0701 - 12/30 0700 In: 2111 [P.O.:240; I.V.:1871] Out: 910 [Urine:900; Blood:10] Intake/Output this shift: No intake/output data recorded.  General appearance: alert, cooperative and no distress Resp: clear to auscultation bilaterally Skin: We have we have about half the packing out will get the rest out later this a.m.  Lab Results:  Recent Labs    07/19/18 1334  WBC 9.1  HGB 8.7*  HCT 30.0*  PLT 434*    BMET Recent Labs    07/19/18 1334  NA 139  K 3.8  CL 109  CO2 21*  GLUCOSE 94  BUN 7  CREATININE 0.86  CALCIUM 8.7*   PT/INR No results for input(s): LABPROT, INR in the last 72 hours.  No results for input(s): AST, ALT, ALKPHOS, BILITOT, PROT, ALBUMIN in the last 168 hours.   Lipase  No results found for: LIPASE   Prior to Admission medications   Medication Sig Start Date End Date Taking? Authorizing Provider  ibuprofen (ADVIL,MOTRIN) 200 MG tablet Take 800 mg by mouth every 6 (six) hours as needed (for pain).   Yes [provider]  sertraline (ZOLOFT) 50 MG tablet Take 50 mg by mouth daily.  05/08/16  Yes [provider]    Medications: . enoxaparin (LOVENOX) injection  40 mg Subcutaneous Q24H  . sertraline  50 mg Oral Daily   Gram stain perirectal abscess: WBC present rare  gram-positive cocci Anti-infectives (From admission, onward)   Start     Dose/Rate Route Frequency Ordered Stop   07/19/18 1745  metroNIDAZOLE (FLAGYL) IVPB 500 mg     500 mg 100 mL/hr over 60 Minutes Intravenous  Once 07/19/18 1735 07/19/18 2135   07/19/18 1330  clindamycin (CLEOCIN) IVPB 600 mg     600 mg 100 mL/hr over 30 Minutes Intravenous  Once 07/19/18 1327 07/19/18 1414      Assessment/Plan Complex posterior perirectal abscess Drainage complex posterior perirectal abscess, 07/20/2018, Dr. Maisie [facility]  FEN: IV fluids/regular diet ID: Flagyl/clindamycin preop DVT: Lovenox Follow-up:  [facility]  Plan: We will switch her over to see oral Augmentin for 2 more days.  Place her in a sitz bath.  She is instructed to keep the area clean and dry as possible.  Have her take sitz baths or showers 2-3 times a day.  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  Aim for discharge later today after the packing is out.  I have personally reviewed the patients medication history on the [place] controlled substance database.    LOS: 0 days    Socrates Cahoon 07/21/2018 [phone]

## 2018-07-21 NOTE — Progress Notes (Signed)
Kristen Miles to be D/C'd  per MD order. Discussed with the patient and all questions fully answered.  VSS, Skin clean, dry and intact without evidence of skin break down, no evidence of skin tears noted.  IV catheter discontinued intact. Site without signs and symptoms of complications. Dressing and pressure applied.  An After Visit Summary was printed and given to the patient. Patient received prescription.  D/c education completed with patient/family including follow up instructions, medication list, d/c activities limitations if indicated, with other d/c instructions as indicated by MD - patient able to verbalize understanding, all questions fully answered.   Patient instructed to return to ED, call 911, or call MD for any changes in condition.   Patient to be escorted via WC, and D/C home via private auto.

## 2018-07-28 LAB — AEROBIC/ANAEROBIC CULTURE (SURGICAL/DEEP WOUND)

## 2018-07-28 LAB — AEROBIC/ANAEROBIC CULTURE W GRAM STAIN (SURGICAL/DEEP WOUND)

## 2020-06-30 IMAGING — CT CT PELVIS W/ CM
2 of 3 series · 16 of 46 positions shown, 18 images · IV contrast (APPLIED)
Comparison: None.

CLINICAL DATA: Acute rectal pain.

EXAM:
CT PELVIS WITH CONTRAST
TECHNIQUE: Multidetector CT imaging of the pelvis was performed using the
standard protocol following the bolus administration of intravenous
contrast.
CONTRAST:  100mL OMNIPAQUE IOHEXOL 300 MG/ML  SOLN

[Series 3: soft tissue · axial · 0.92mm/px · z∈[-475,-177]mm · 13 of 173 slices shown, 15 images]
[im 12/173  soft-tissue]
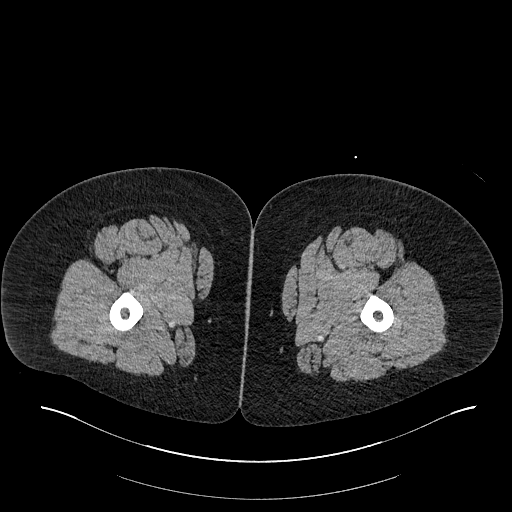
[im 12/173  bone]
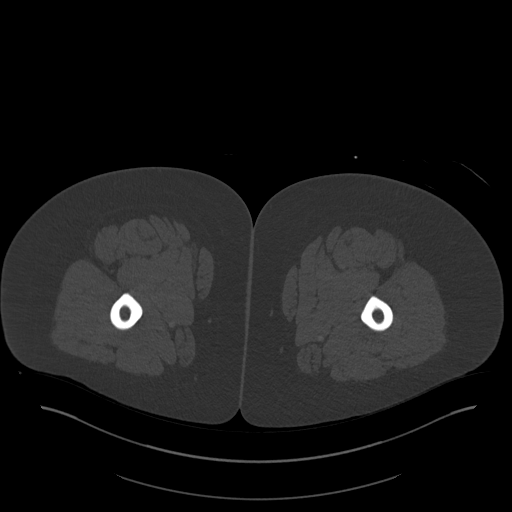
[im 23/173  soft-tissue]
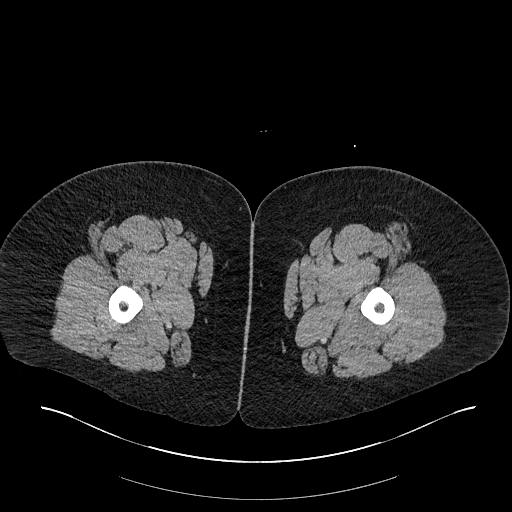
[im 34/173  soft-tissue]
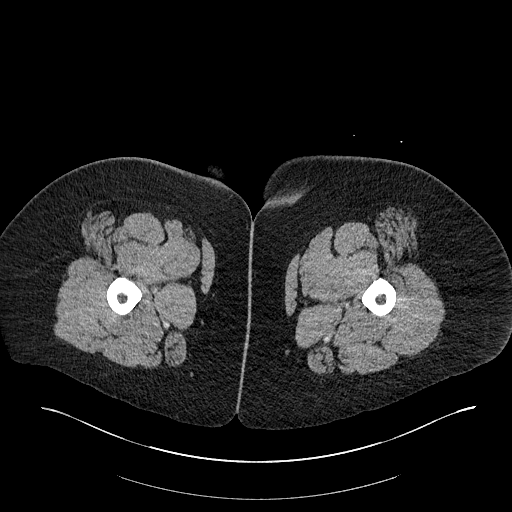
[im 50/173  soft-tissue]
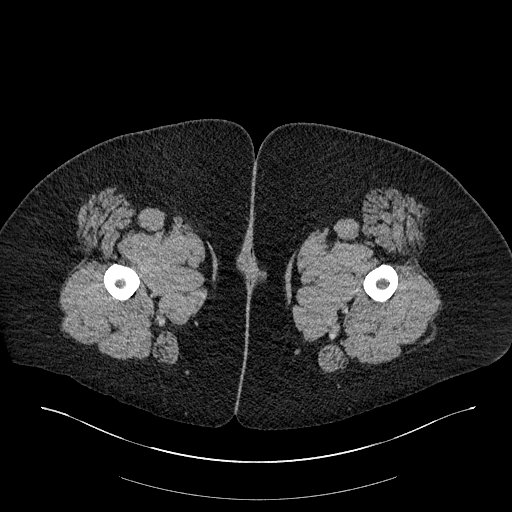
[im 62/173  soft-tissue]
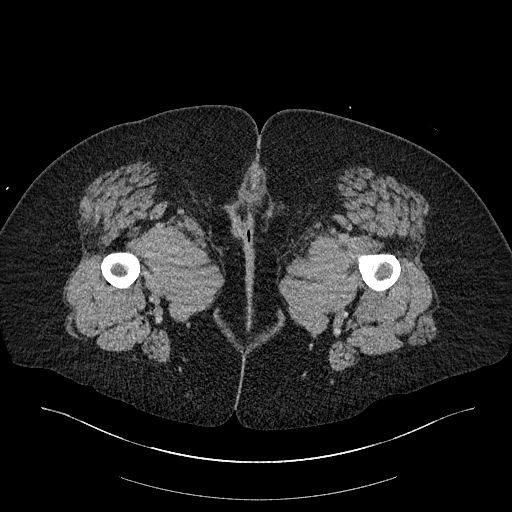
[im 73/173  soft-tissue]
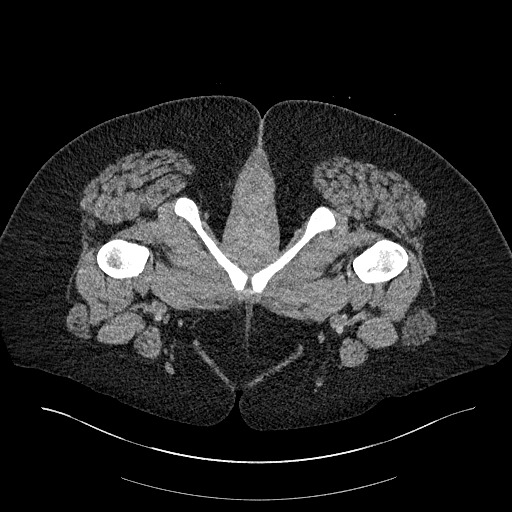
[im 89/173  soft-tissue]
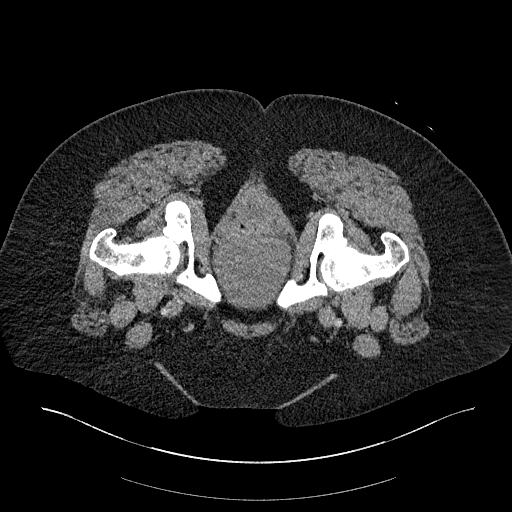
[im 100/173  soft-tissue]
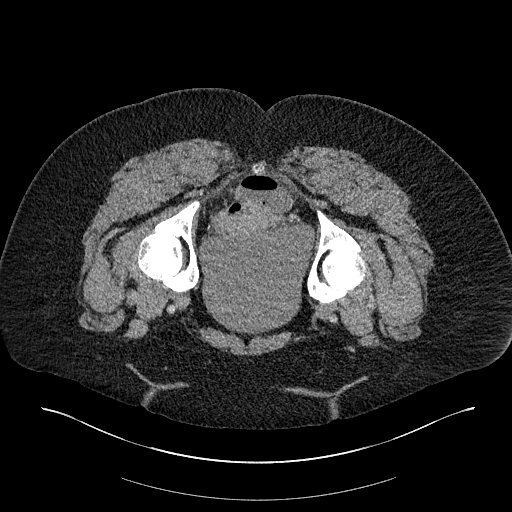
[im 111/173  soft-tissue]
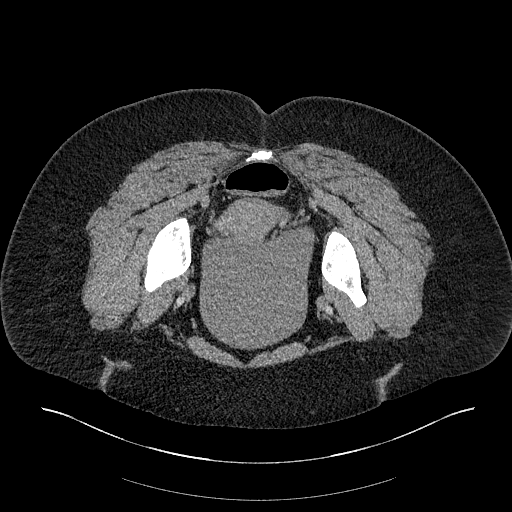
[im 111/173  bone]
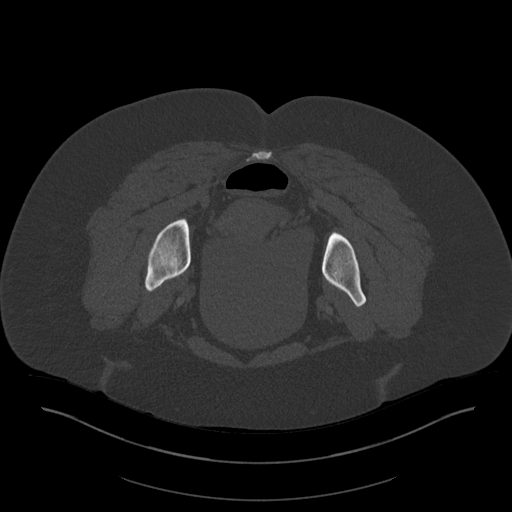
[im 123/173  soft-tissue]
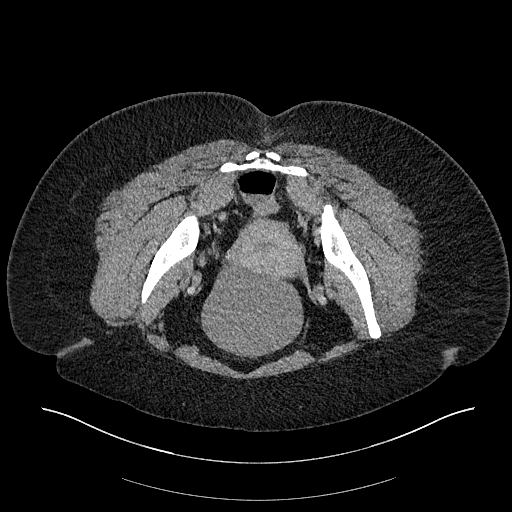
[im 139/173  soft-tissue]
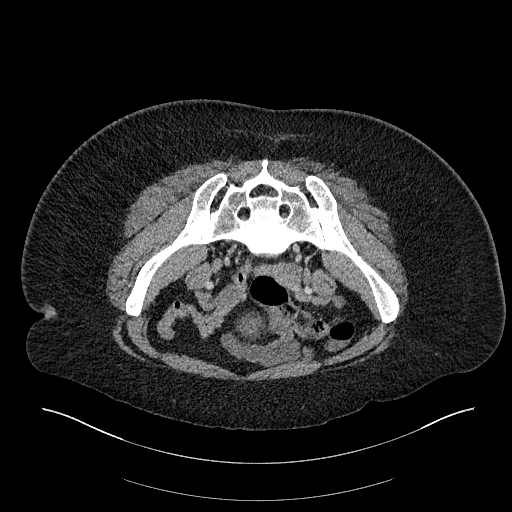
[im 150/173  soft-tissue]
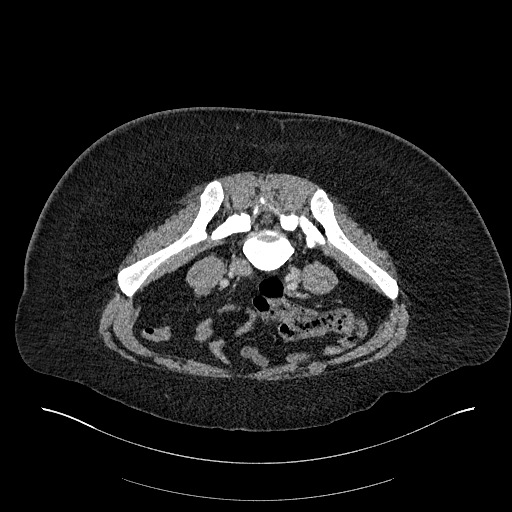
[im 161/173  soft-tissue]
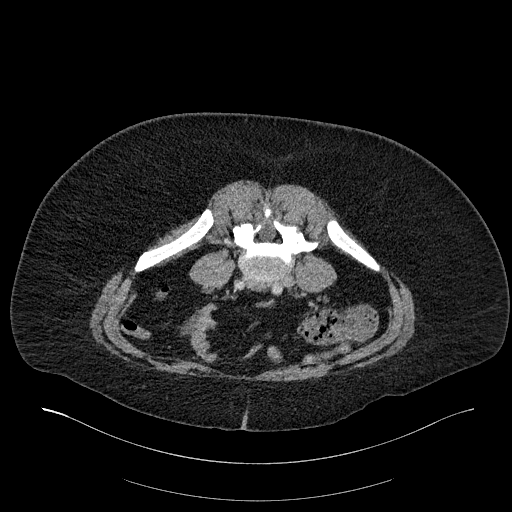

[Series 6: cor soft · coronal · 0.73mm/px · 3 of 147 slices shown]
[im 49/147  soft-tissue]
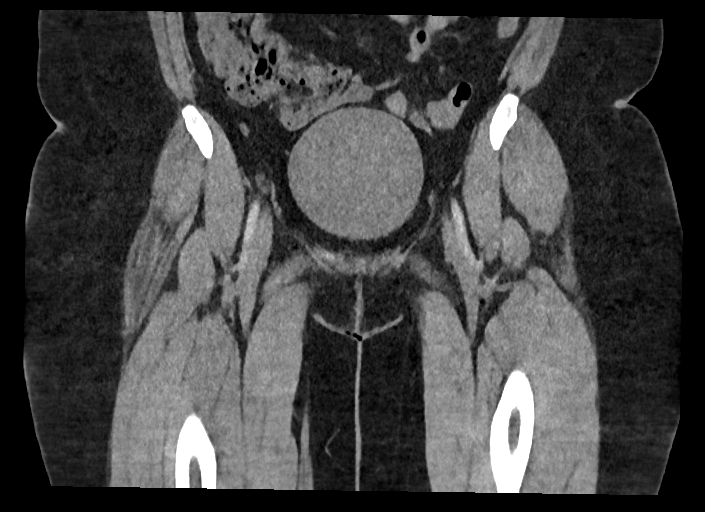
[im 65/147  soft-tissue]
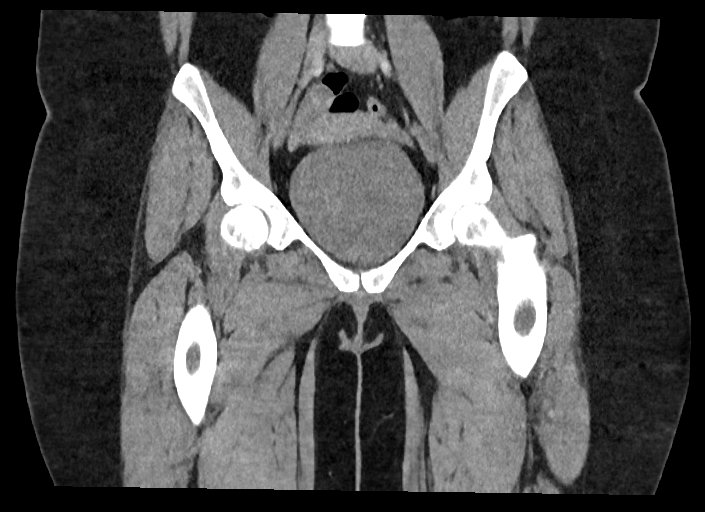
[im 82/147  soft-tissue]
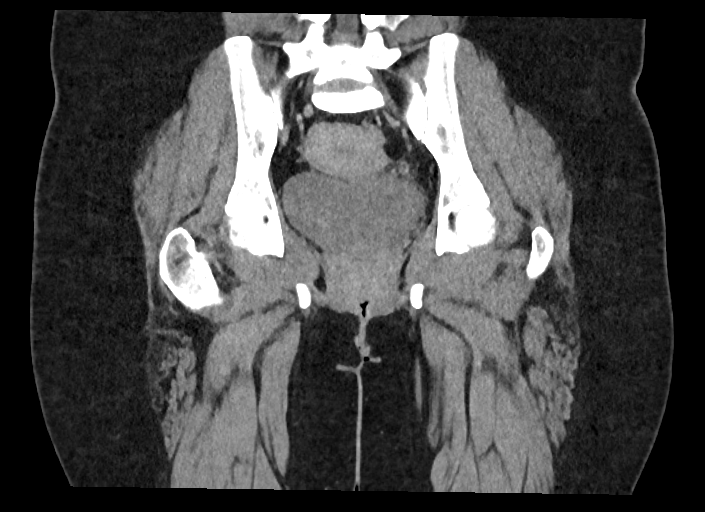

[16 of 46 positions shown; findings below may reference images not displayed]

FINDINGS: Urinary Tract:  No abnormality visualized.

Bowel: There is no evidence of bowel dilatation or obstruction. The
appendix is unremarkable. There is seen probable small fluid
collection measuring 2.7 by 2.3 x 1.7 cm in the perianal region
concerning for possible small abscess.

Vascular/Lymphatic: No pathologically enlarged lymph nodes. No
significant vascular abnormality seen.

Reproductive:  No mass or other significant abnormality

Other:  No hernia is noted.  No ascites is noted.

Musculoskeletal: No suspicious bone lesions identified.
IMPRESSION: 2.7 x 2.3 x 1.7 cm probable small fluid collection seen in the
perianal region concerning for small abscess.
